# Patient Record
Sex: Female | Born: 1990 | Race: White | Hispanic: No | Marital: Single | Smoking: Current every day smoker
Health system: Southern US, Community
[De-identification: ages and names within clinical notes are randomized; demographics above are authoritative.]

## PROBLEM LIST (undated history)

## (undated) HISTORY — PX: TONSILLECTOMY: SUR1361

---
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
## 2012-04-18 ENCOUNTER — Emergency Department: Payer: Self-pay | Admitting: Emergency Medicine

## 2013-09-13 ENCOUNTER — Emergency Department: Payer: Self-pay | Admitting: Internal Medicine

## 2015-07-30 ENCOUNTER — Emergency Department
Admission: EM | Admit: 2015-07-30 | Discharge: 2015-07-30 | Disposition: A | Discharge status: Home / Self Care | Payer: Self-pay | Attending: Emergency Medicine | Admitting: Emergency Medicine

## 2015-07-30 ENCOUNTER — Encounter: Payer: Self-pay | Admitting: Emergency Medicine

## 2015-07-30 ENCOUNTER — Emergency Department: Payer: Self-pay

## 2015-07-30 DIAGNOSIS — F1410 Cocaine abuse, uncomplicated: Secondary | ICD-10-CM | POA: Insufficient documentation

## 2015-07-30 DIAGNOSIS — T405X1A Poisoning by cocaine, accidental (unintentional), initial encounter: Secondary | ICD-10-CM | POA: Insufficient documentation

## 2015-07-30 DIAGNOSIS — Y9289 Other specified places as the place of occurrence of the external cause: Secondary | ICD-10-CM | POA: Insufficient documentation

## 2015-07-30 DIAGNOSIS — Y9389 Activity, other specified: Secondary | ICD-10-CM | POA: Insufficient documentation

## 2015-07-30 DIAGNOSIS — F1310 Sedative, hypnotic or anxiolytic abuse, uncomplicated: Secondary | ICD-10-CM | POA: Insufficient documentation

## 2015-07-30 DIAGNOSIS — T50901A Poisoning by unspecified drugs, medicaments and biological substances, accidental (unintentional), initial encounter: Secondary | ICD-10-CM

## 2015-07-30 DIAGNOSIS — F1110 Opioid abuse, uncomplicated: Secondary | ICD-10-CM | POA: Insufficient documentation

## 2015-07-30 DIAGNOSIS — Y998 Other external cause status: Secondary | ICD-10-CM | POA: Insufficient documentation

## 2015-07-30 DIAGNOSIS — T391X1A Poisoning by 4-Aminophenol derivatives, accidental (unintentional), initial encounter: Secondary | ICD-10-CM | POA: Insufficient documentation

## 2015-07-30 DIAGNOSIS — F17210 Nicotine dependence, cigarettes, uncomplicated: Secondary | ICD-10-CM | POA: Insufficient documentation

## 2015-07-30 LAB — CBC WITH DIFFERENTIAL/PLATELET
BASOS ABS: 0 10*3/uL (ref 0–0.1)
Basophils Relative: 0 %
Eosinophils Absolute: 0.2 10*3/uL (ref 0–0.7)
Eosinophils Relative: 2 %
HEMATOCRIT: 40.9 % (ref 35.0–47.0)
Hemoglobin: 14 g/dL (ref 12.0–16.0)
LYMPHS PCT: 24 %
Lymphs Abs: 2.3 10*3/uL (ref 1.0–3.6)
MCH: 30.5 pg (ref 26.0–34.0)
MCHC: 34.2 g/dL (ref 32.0–36.0)
MCV: 89.2 fL (ref 80.0–100.0)
MONO ABS: 0.7 10*3/uL (ref 0.2–0.9)
Monocytes Relative: 7 %
NEUTROS ABS: 6.6 10*3/uL — AB (ref 1.4–6.5)
Neutrophils Relative %: 67 %
Platelets: 237 10*3/uL (ref 150–440)
RBC: 4.59 MIL/uL (ref 3.80–5.20)
RDW: 13.4 % (ref 11.5–14.5)
WBC: 9.9 10*3/uL (ref 3.6–11.0)

## 2015-07-30 LAB — COMPREHENSIVE METABOLIC PANEL
ALBUMIN: 3.9 g/dL (ref 3.5–5.0)
ALK PHOS: 52 U/L (ref 38–126)
ALT: 16 U/L (ref 14–54)
AST: 17 U/L (ref 15–41)
Anion gap: 7 (ref 5–15)
BUN: 8 mg/dL (ref 6–20)
CALCIUM: 8.8 mg/dL — AB (ref 8.9–10.3)
CO2: 25 mmol/L (ref 22–32)
CREATININE: 0.65 mg/dL (ref 0.44–1.00)
Chloride: 105 mmol/L (ref 101–111)
GFR calc Af Amer: >60 mL/min (ref >60)
GFR calc non Af Amer: >60 mL/min (ref >60)
GLUCOSE: 99 mg/dL (ref 65–99)
Potassium: 2.9 mmol/L — CL (ref 3.5–5.1)
SODIUM: 137 mmol/L (ref 135–145)
Total Bilirubin: 0.2 mg/dL — ABNORMAL LOW (ref 0.3–1.2)
Total Protein: 6.9 g/dL (ref 6.5–8.1)

## 2015-07-30 LAB — URINE DRUG SCREEN, QUALITATIVE (ARMC ONLY)
AMPHETAMINES, UR SCREEN: NOT DETECTED
Barbiturates, Ur Screen: NOT DETECTED
Benzodiazepine, Ur Scrn: POSITIVE — AB
Cannabinoid 50 Ng, Ur ~~LOC~~: NOT DETECTED
Cocaine Metabolite,Ur ~~LOC~~: POSITIVE — AB
MDMA (ECSTASY) UR SCREEN: NOT DETECTED
Methadone Scn, Ur: NOT DETECTED
OPIATE, UR SCREEN: POSITIVE — AB
PHENCYCLIDINE (PCP) UR S: NOT DETECTED
Tricyclic, Ur Screen: NOT DETECTED

## 2015-07-30 LAB — TROPONIN I

## 2015-07-30 LAB — ACETAMINOPHEN LEVEL: Acetaminophen (Tylenol), Serum: <10 ug/mL — ABNORMAL LOW (ref 10–30)

## 2015-07-30 MED ORDER — SODIUM CHLORIDE 0.9 % IV BOLUS (SEPSIS)
1000.0000 mL | Freq: Once | INTRAVENOUS | Status: AC
  Administered 2015-07-30: 1000 mL via INTRAVENOUS

## 2015-07-30 MED ORDER — IBUPROFEN 800 MG PO TABS
800.0000 mg | ORAL_TABLET | Freq: Once | ORAL | Status: AC
  Administered 2015-07-30: 800 mg via ORAL
  Filled 2015-07-30: qty 1

## 2015-07-30 MED ORDER — POTASSIUM CHLORIDE CRYS ER 20 MEQ PO TBCR
40.0000 meq | EXTENDED_RELEASE_TABLET | Freq: Once | ORAL | Status: AC
  Administered 2015-07-30: 40 meq via ORAL
  Filled 2015-07-30: qty 2

## 2015-07-30 NOTE — ED Notes (Signed)
Mayra NT is sitting with pt per Dr. Darnelle CatalanMalinda

## 2015-07-30 NOTE — Discharge Instructions (Signed)
Accidental Overdose °A drug overdose occurs when a chemical substance (drug or medication) is used in amounts large enough to overcome a person. This may result in severe illness or death. This is a type of poisoning. Accidental overdoses of medications or other substances come from a variety of reasons. When this happens accidentally, it is often because the person taking the substance does not know enough about what they have taken. Drugs which commonly cause overdose deaths are alcohol, psychotropic medications (medications which affect the mind), pain medications, illegal drugs (street drugs) such as cocaine and heroin, and multiple drugs taken at the same time. It may result from careless behavior (such as over-indulging at a party). Other causes of overdose may include multiple drug use, a lapse in memory, or drug use after a period of no drug use.  °Sometimes overdosing occurs because a person cannot remember if they have taken their medication.  °A common unintentional overdose in young children involves multi-vitamins containing iron. Iron is a part of the hemoglobin molecule in blood. It is used to transport oxygen to living cells. When taken in small amounts, iron allows the body to restock hemoglobin. In large amounts, it causes problems in the body. If this overdose is not treated, it can lead to death. °Never take medicines that show signs of tampering or do not seem quite right. Never take medicines in the dark or in poor lighting. Read the label and check each dose of medicine before you take it. When adults are poisoned, it happens most often through carelessness or lack of information. Taking medicines in the dark or taking medicine prescribed for someone else to treat the same type of problem is a dangerous practice. °SYMPTOMS  °Symptoms of overdose depend on the medication and amount taken. They can vary from over-activity with stimulant over-dosage, to sleepiness from depressants such as  alcohol, narcotics and tranquilizers. Confusion, dizziness, nausea and vomiting may be present. If problems are severe enough coma and death may result. °DIAGNOSIS  °Diagnosis and management are generally straightforward if the drug is known. Otherwise it is more difficult. At times, certain symptoms and signs exhibited by the patient, or blood tests, can reveal the drug in question.  °TREATMENT  °In an emergency department, most patients can be treated with supportive measures. Antidotes may be available if there has been an overdose of opioids or benzodiazepines. A rapid improvement will often occur if this is the cause of overdose. °At home or away from medical care: °· There may be no immediate problems or warning signs in children. °· Not everything works well in all cases of poisoning. °· Take immediate action. Poisons may act quickly. °· If you think someone has swallowed medicine or a household product, and the person is unconscious, having seizures (convulsions), or is not breathing, immediately call for an ambulance. °IF a person is conscious and appears to be doing OK but has swallowed a poison: °· Do not wait to see what effect the poison will have. Immediately call a poison control center (listed in the white pages of your telephone book under "Poison Control" or inside the front cover with other emergency numbers). Some poison control centers have TTY capability for the deaf. Check with your local center if you or someone in your family requires this service. °· Keep the container so you can read the label on the product for ingredients. °· Describe what, when, and how much was taken and the age and condition of the person poisoned.   Inform them if the person is vomiting, choking, drowsy, shows a change in color or temperature of skin, is conscious or unconscious, or is convulsing.  Do not cause vomiting unless instructed by medical personnel. Do not induce vomiting or force liquids into a person who  is convulsing, unconscious, or very drowsy. Stay calm and in control.   Activated charcoal also is sometimes used in certain types of poisoning and you may wish to add a supply to your emergency medicines. It is available without a prescription. Call a poison control center before using this medication. PREVENTION  Thousands of children die every year from unintentional poisoning. This may be from household chemicals, poisoning from carbon monoxide in a car, taking their parent's medications, or simply taking a few iron pills or vitamins with iron. Poisoning comes from unexpected sources.  Store medicines out of the sight and reach of children, preferably in a locked cabinet. Do not keep medications in a food cabinet. Always store your medicines in a secure place. Get rid of expired medications.  If you have children living with you or have them as occasional guests, you should have child-resistant caps on your medicine containers. Keep everything out of reach. Child proof your home.  If you are called to the telephone or to answer the door while you are taking a medicine, take the container with you or put the medicine out of the reach of small children.  Do not take your medication in front of children. Do not tell your child how good a medication is and how good it is for them. They may get the idea it is more of a treat.  If you are an adult and have accidentally taken an overdose, you need to consider how this happened and what can be done to prevent it from happening again. If this was from a street drug or alcohol, determine if there is a problem that needs addressing. If you are not sure a problems exists, it is easy to talk to a professional and ask them if they think you have a problem. It is better to handle this problem in this way before it happens again and has a much worse consequence.   This information is not intended to replace advice given to you by your health care provider. Make  sure you discuss any questions you have with your health care provider.   Document Released: 07/14/2004 Document Revised: 05/21/2014 Document Reviewed: 10/18/2014 Elsevier Interactive Patient Education [employer]2016 [employer]   Be very careful you almost died tonight. You stop breathing and turned blue. Any longer and you would've had brain damage. Also too much Percocet can give you a Tylenol overdose which can destroy her liver resulting in the need for liver transplant or death. Be careful.

## 2015-07-30 NOTE — ED Provider Notes (Addendum)
[facility] Emergency Department Provider Note  ____________________________________________  Time seen: Approximately 3:34 AM  I have reviewed the triage vital signs and the nursing notes.   HISTORY  Chief Complaint Drug Overdose    HPI Sara Mcknight is a 25 y.o. 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 Her friend who is with her was 77 called 911 and EMS says they were told she gave CPR. [place] [employer] arrived and gave her 2 of Narcan intranasally and EMS then gave her another 2 of Narcan intranasally after which an 6 rest with bag-valve-mask patient woke up and became awake alert and oriented.  History reviewed. No pertinent past medical history.  There are no active problems to display for this patient.   Past Surgical History  Procedure Laterality Date  . Tonsillectomy      No current outpatient prescriptions on file.  Allergies Review of patient's allergies indicates no known allergies.  No family history on file.  Social History Social History  Substance Use Topics  . Smoking status: Current Every Day Smoker -- 1.00 packs/day    Types: Cigarettes  . Smokeless tobacco: Never Used  . Alcohol Use: No   patient smokes a pack per day  Review of Systems Constitutional: No fever/chills Eyes: No visual changes. ENT: No sore throat. Cardiovascular: Denies chest pain. Respiratory: Denies shortness of breath. Gastrointestinal: No abdominal pain.  No nausea, no vomiting.  No diarrhea.  No constipation. Genitourinary: Negative for dysuria. Musculoskeletal: Negative for back pain. Skin: Negative for rash. Neurological: Negative for headaches, focal weakness or numbness.  10-point ROS otherwise negative.  ____________________________________________   PHYSICAL EXAM:  VITAL SIGNS: ED Triage Vitals  Enc Vitals Group     BP 07/30/15 0331 137/83  mmHg     Pulse Rate 07/30/15 0331 107     Resp 07/30/15 0331 19     Temp 07/30/15 0331 97.9 F (36.6 C)     Temp Source 07/30/15 0331 Oral     SpO2 07/30/15 0331 100 %     Weight 07/30/15 0331 190 lb (86.183 kg)     Height 07/30/15 0331  (1.753 m)     Head Cir --      Peak Flow --      Pain Score 07/30/15 0333 0     Pain Loc --      Pain Edu? --      Excl. in GC? --     Constitutional: Alert and oriented. Well appearing and in no acute distress. Eyes: Conjunctivae are normal. PERRL. EOMI. Head: Atraumatic. Nose: No congestion/rhinnorhea. Mouth/Throat: Mucous membranes are moist.  Oropharynx non-erythematous. Neck: No stridor.   Cardiovascular: Normal rate, regular rhythm. Grossly normal heart sounds.  Good peripheral circulation. Respiratory: Normal respiratory effort.  No retractions. Lungs CTAB. Gastrointestinal: Soft and nontender. No distention. No abdominal bruits. No CVA tenderness. Musculoskeletal: No lower extremity tenderness nor edema.  No joint effusions. Neurologic:  Normal speech and language. No gross focal neurologic deficits are appreciated. No gait instability. Skin:  Skin is warm, dry and intact. No rash noted. Track marks on the arms Psychiatric: Mood and affect are normal. Speech and behavior are normal.  ____________________________________________   LABS (all labs ordered are listed, but only abnormal results are displayed)  Labs Reviewed  COMPREHENSIVE METABOLIC PANEL - Abnormal; Notable for the following:    Potassium 2.9 (*)    Calcium 8.8 (*)  Total Bilirubin 0.2 (*)    All other components within normal limits  CBC WITH DIFFERENTIAL/PLATELET - Abnormal; Notable for the following:    Neutro Abs 6.6 (*)    All other components within normal limits  URINE DRUG SCREEN, QUALITATIVE ([facility] ONLY) - Abnormal; Notable for the following:    Cocaine Metabolite,Ur Dana POSITIVE (*)    Opiate, Ur Screen POSITIVE (*)    Benzodiazepine, Ur Scrn POSITIVE  (*)    All other components within normal limits  ACETAMINOPHEN LEVEL - Abnormal; Notable for the following:    Acetaminophen (Tylenol), Serum <10 (*)    All other components within normal limits  TROPONIN I   ____________________________________________  EKG  EKG read and interpreted by me shows normal sinus rhythm rate of 96 normal axis nonspecific ST-T wave changes ____________________________________________  RADIOLOGY  Chest x-ray read by radiology as normal no acute disease ____________________________________________   PROCEDURES    ____________________________________________   INITIAL IMPRESSION / ASSESSMENT AND PLAN / ED COURSE  Pertinent labs & imaging results that were available during my care of the patient were reviewed by me and considered in my medical decision making (see chart for details).  Patient watched ____________________________________________   FINAL CLINICAL IMPRESSION(S) / ED DIAGNOSES  Final diagnoses:  Drug overdose, accidental or unintentional, initial encounter      Arnaldo NatalPaul F Eirik Schueler, MD 08/02/15 1120     Arnaldo NatalPaul F Breeanne Oblinger, MD 08/02/15 [phone]

## 2015-07-30 NOTE — ED Notes (Signed)
Per [facility] pt found by family member in respiratory arrest. EMS reports pt given four of narcan intranasal. Pt reports injecting cocaine and taking percocet tablets of unknown quantity at this time. Pt is a/o at this time in triage.

## 2015-07-30 NOTE — ED Notes (Signed)
Pt refused wheelchair to the lobby. Pt refused exit vital signs stating, "I just want to leave". Pt ambulatory to the lobby at this time. Pt alert and oriented. NAD noted. Pt left with a friend that was in the room when this RN arrived. Skin warm, dry, and intact. This RN reviewed instructions concerning the Narcan auto injector and [facility] per Dr. Farrel GobbleMalinda's D/C instruction.

## 2015-07-30 NOTE — ED Notes (Signed)
Pt denies SI/HI at this time, states she took drug to get high.
